# Patient Record
Sex: Male | Born: 1965 | Race: Black or African American | Hispanic: No
Health system: Midwestern US, Community
[De-identification: ages and names within clinical notes are randomized; demographics above are authoritative.]

## PROBLEM LIST (undated history)

## (undated) DIAGNOSIS — F4310 Post-traumatic stress disorder, unspecified: Secondary | ICD-10-CM

## (undated) DIAGNOSIS — H409 Unspecified glaucoma: Secondary | ICD-10-CM

---

## 2014-05-10 ENCOUNTER — Emergency Department (HOSPITAL_COMMUNITY)
Admission: EM | Admit: 2014-05-10 | Discharge: 2014-05-10 | Disposition: A | Discharge status: Home / Self Care | Payer: Non-veteran care | Attending: Emergency Medicine | Admitting: Emergency Medicine

## 2014-05-10 ENCOUNTER — Encounter (HOSPITAL_COMMUNITY): Payer: Self-pay | Admitting: Emergency Medicine

## 2014-05-10 DIAGNOSIS — S0083XA Contusion of other part of head, initial encounter: Secondary | ICD-10-CM | POA: Insufficient documentation

## 2014-05-10 DIAGNOSIS — S0003XA Contusion of scalp, initial encounter: Secondary | ICD-10-CM | POA: Diagnosis not present

## 2014-05-10 DIAGNOSIS — S0190XA Unspecified open wound of unspecified part of head, initial encounter: Secondary | ICD-10-CM | POA: Diagnosis present

## 2014-05-10 DIAGNOSIS — S0191XA Laceration without foreign body of unspecified part of head, initial encounter: Secondary | ICD-10-CM

## 2014-05-10 DIAGNOSIS — S1093XA Contusion of unspecified part of neck, initial encounter: Secondary | ICD-10-CM

## 2014-05-10 DIAGNOSIS — H409 Unspecified glaucoma: Secondary | ICD-10-CM | POA: Diagnosis not present

## 2014-05-10 DIAGNOSIS — Z8659 Personal history of other mental and behavioral disorders: Secondary | ICD-10-CM | POA: Insufficient documentation

## 2014-05-10 DIAGNOSIS — F17200 Nicotine dependence, unspecified, uncomplicated: Secondary | ICD-10-CM | POA: Insufficient documentation

## 2014-05-10 HISTORY — DX: Unspecified glaucoma: H40.9

## 2014-05-10 HISTORY — DX: Post-traumatic stress disorder, unspecified: F43.10

## 2014-05-10 MED ORDER — FLUORESCEIN SODIUM 1 MG OP STRP
1.0000 | ORAL_STRIP | Freq: Once | OPHTHALMIC | Status: AC
  Administered 2014-05-10: 12:00:00 via OPHTHALMIC
  Filled 2014-05-10: qty 1

## 2014-05-10 MED ORDER — TETRACAINE HCL 0.5 % OP SOLN
1.0000 [drp] | Freq: Once | OPHTHALMIC | Status: AC
  Administered 2014-05-10: 1 [drp] via OPHTHALMIC
  Filled 2014-05-10: qty 2

## 2014-05-10 NOTE — ED Provider Notes (Signed)
Medical screening examination/treatment/procedure(s) were performed by non-physician practitioner and as supervising physician I was immediately available for consultation/collaboration.   EKG Interpretation None        Shon Baton, MD 05/10/14 1944

## 2014-05-10 NOTE — ED Notes (Signed)
Pt. Refused visual acuity.
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
## 2014-05-10 NOTE — Discharge Instructions (Signed)
Please follow up with your primary care physician in 1-2 days. If you do not have one please call the [facility] and wellness Center number listed above. Please follow up with ENT Doctor as needed to schedule a follow up appointment.  Please read all discharge instructions and return precautions.    Head Injury You have received a head injury. It does not appear serious at this time. Headaches and vomiting are common following head injury. It should be easy to awaken from sleeping. Sometimes it is necessary for you to stay in the emergency department for a while for observation. Sometimes admission to the hospital may be needed. After injuries such as yours, most problems occur within the first 24 hours, but side effects may occur up to 7-10 days after the injury. It is important for you to carefully monitor your condition and contact your health care provider or seek immediate medical care if there is a change in your condition. WHAT ARE THE TYPES OF HEAD INJURIES? Head injuries can be as minor as a bump. Some head injuries can be more severe. More severe head injuries include:  A jarring injury to the brain (concussion).  A bruise of the brain (contusion). This mean there is bleeding in the brain that can cause swelling.  A cracked skull (skull fracture).  Bleeding in the brain that collects, clots, and forms a bump (hematoma). WHAT CAUSES A HEAD INJURY? A serious head injury is most likely to happen to someone who is in a car wreck and is not wearing a seat belt. Other causes of major head injuries include bicycle or motorcycle accidents, sports injuries, and falls. HOW ARE HEAD INJURIES DIAGNOSED? A complete history of the event leading to the injury and your current symptoms will be helpful in diagnosing head injuries. Many times, pictures of the brain, such as CT or MRI are needed to see the extent of the injury. Often, an overnight hospital stay is necessary for observation.  WHEN SHOULD  I SEEK IMMEDIATE MEDICAL CARE?  You should get help right away if:  You have confusion or drowsiness.  You feel sick to your stomach (nauseous) or have continued, forceful vomiting.  You have dizziness or unsteadiness that is getting worse.  You have severe, continued headaches not relieved by medicine. Only take over-the-counter or prescription medicines for pain, fever, or discomfort as directed by your health care provider.  You do not have normal function of the arms or legs or are unable to walk.  You notice changes in the black spots in the center of the colored part of your eye (pupil).  You have a clear or bloody fluid coming from your nose or ears.  You have a loss of vision. During the next 24 hours after the injury, you must stay with someone who can watch you for the warning signs. This person should contact local emergency services (911 in the [place] if you have seizures, you become unconscious, or you are unable to wake up. HOW CAN I PREVENT A HEAD INJURY IN THE FUTURE? The most important factor for preventing major head injuries is avoiding motor vehicle accidents. To minimize the potential for damage to your head, it is crucial to wear seat belts while riding in motor vehicles. Wearing helmets while bike riding and playing collision sports (like football) is also helpful. Also, avoiding dangerous activities around the house will further help reduce your risk of head injury.  WHEN CAN I RETURN TO NORMAL ACTIVITIES AND ATHLETICS?  You should be reevaluated by your health care provider before returning to these activities. If you have any of the following symptoms, you should not return to activities or contact sports until 1 week after the symptoms have stopped:  Persistent headache.  Dizziness or vertigo.  Poor attention and concentration.  Confusion.  Memory problems.  Nausea or vomiting.  Fatigue or tire easily.  Irritability.  Intolerant of bright lights or  loud noises.  Anxiety or depression.  Disturbed sleep. MAKE SURE YOU:   Understand these instructions.  Will watch your condition.  Will get help right away if you are not doing well or get worse. Document Released: 07/31/2005 Document Revised: 08/05/2013 Document Reviewed: 04/07/2013 [facility] Patient Information 2015 [place], [place]. This information is not intended to replace advice given to you by your health care provider. Make sure you discuss any questions you have with your health care provider. Non-Sutured Laceration A laceration is a cut or wound that goes through all layers of the skin and into the tissue just beneath the skin. Usually, these are stitched up or held together with tape or glue shortly after the injury occurred. However, if several or more hours have passed before getting care, too many germs (bacteria) get into the laceration. Stitching it closed would bring the risk of infection. If your health care provider feels your laceration is too old, it may be left open and then bandaged to allow healing from the bottom layer up. HOME CARE INSTRUCTIONS   Change the bandage (dressing) 2 times a day or as directed by your health care provider.  If the dressing or packing gauze sticks, soak it off with soapy water.  When you re-bandage your laceration, make sure that the dressing or packing gauze goes all the way to the bottom of the laceration. The top of the laceration is kept open so it can heal from the bottom up. There is less chance for infection with this method.  Wash the area with soap and water 2 times a day to remove all the creams or ointments, if used. Rinse off the soap. Pat the area dry with a clean towel. Look for signs of infection, such as redness, swelling, or a red line that goes away from the laceration.  Re-apply creams or ointments if they were used to bandage the laceration. This helps keep the bandage from sticking.  If the bandage becomes wet,  dirty, or has a bad smell, change it as soon as possible.  Only take medicine as directed by your health care provider. You might need a tetanus shot now if:  You have no idea when you had the last one.  You have never had a tetanus shot before.  Your laceration had dirt in it.  Your laceration was dirty, and your last tetanus shot was more than 7 years ago.  Your laceration was clean, and your last tetanus shot was more than 10 years ago. If you need a tetanus shot, and you decide not to get one, there is a rare chance of getting tetanus. Sickness from tetanus can be serious. If you got a tetanus shot, your arm may swell and get red and warm to the touch at the shot site. This is common and not a problem. SEEK MEDICAL CARE IF:   You have redness, swelling, or increasing pain in the laceration.  You notice a red line that goes away from your laceration.  You have pus coming from the laceration.  You have  a fever.  You notice a bad smell coming from the laceration or dressing.  You notice something coming out of the laceration, such as wood or glass.  Your laceration is on your hand or foot and you are unable to properly move a finger or toe.  You have severe swelling around the laceration, causing pain and numbness.  You notice a change in color in your arm, hand, leg, or foot. MAKE SURE YOU:   Understand these instructions.  Will watch your condition.  Will get help right away if you are not doing well or get worse. Document Released: 06/28/2006 Document Revised: 08/05/2013 Document Reviewed: 01/18/2009 [facility] Patient Information 2015 [place], [place]. This information is not intended to replace advice given to you by your health care provider. Make sure you discuss any questions you have with your health care provider.
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
## 2020-10-20 ENCOUNTER — Inpatient Hospital Stay
Admit: 2020-10-20 | Discharge: 2020-10-23 | Disposition: A | Discharge status: Other Acute Inpatient Hospital | Attending: Emergency Medicine

## 2020-10-20 ENCOUNTER — Emergency Department: Admit: 2020-10-20 | Primary: Family Medicine

## 2020-10-20 ENCOUNTER — Emergency Department: Primary: Family Medicine

## 2020-10-20 DIAGNOSIS — F29 Unspecified psychosis not due to a substance or known physiological condition: Secondary | ICD-10-CM

## 2020-10-20 LAB — METABOLIC PANEL, COMPREHENSIVE
ALT (SGPT): 42 U/L (ref 12–78)
AST (SGOT): 42 U/L — ABNORMAL HIGH (ref 15–37)
Albumin: 4.4 gm/dl (ref 3.4–5.0)
Alk. phosphatase: 77 U/L (ref 45–117)
Anion gap: 7 mmol/L (ref 5–15)
BUN: 19 mg/dl (ref 7–25)
Bilirubin, total: 0.9 mg/dl (ref 0.2–1.0)
CO2: 28 mEq/L (ref 21–32)
Calcium: 10.1 mg/dl (ref 8.5–10.1)
Chloride: 105 mEq/L (ref 98–107)
Creatinine: 1.1 mg/dl (ref 0.6–1.3)
GFR est AA: >60
GFR est non-AA: >60
Glucose: 68 mg/dl — ABNORMAL LOW (ref 74–106)
Potassium: 4.5 mEq/L (ref 3.5–5.1)
Protein, total: 8.1 gm/dl (ref 6.4–8.2)
Sodium: 140 mEq/L (ref 136–145)

## 2020-10-20 LAB — ETHYL ALCOHOL
ALCOHOL(ETHYL),SERUM: <3 mg/dl (ref 0.0–9.0)
Ethyl Alcohol: <3 mg/dl (ref 0.0–9.0)

## 2020-10-20 LAB — LIPASE
Lipase: 58 U/L — ABNORMAL LOW (ref 73–393)
Lipase: 58 U/L — ABNORMAL LOW (ref 73–393)

## 2020-10-20 LAB — ACETAMINOPHEN: Acetaminophen level: <2 ug/mL — ABNORMAL LOW (ref 10.0–30.0)

## 2020-10-20 LAB — CBC WITH AUTOMATED DIFF
BASOPHILS: 0.4 % (ref 0–3)
EOSINOPHILS: 0 % (ref 0–5)
HCT: 43.2 % (ref 37.0–50.0)
HGB: 14.3 gm/dl (ref 12.4–17.2)
IMMATURE GRANULOCYTES: 0.2 % (ref 0.0–3.0)
LYMPHOCYTES: 14.2 % — ABNORMAL LOW (ref 28–48)
MCH: 30 pg (ref 23.0–34.6)
MCHC: 33.1 gm/dl (ref 30.0–36.0)
MCV: 90.8 fL (ref 80.0–98.0)
MONOCYTES: 8.6 % (ref 1–13)
MPV: 10.2 fL — ABNORMAL HIGH (ref 6.0–10.0)
NEUTROPHILS: 76.6 % — ABNORMAL HIGH (ref 34–64)
NRBC: 0 (ref 0–0)
PLATELET: 292 10*3/uL (ref 140–450)
RBC: 4.76 M/uL (ref 3.80–5.70)
RDW-SD: 45.1 — ABNORMAL HIGH (ref 35.1–43.9)
WBC: 11.3 10*3/uL — ABNORMAL HIGH (ref 4.0–11.0)

## 2020-10-20 LAB — COVID-19 (INPATIENT TESTING): COVID-19: NEGATIVE

## 2020-10-20 LAB — SALICYLATE
Salicyclic Acid: 2.4 mg/dl — ABNORMAL LOW (ref 2.8–20.0)
Salicylate: 2.4 mg/dl — ABNORMAL LOW (ref 2.8–20.0)

## 2020-10-20 LAB — GLUCOSE, POC: Glucose (POC): 67 mg/dL (ref 65–105)

## 2020-10-20 LAB — COVID-19: COVID-19: NEGATIVE

## 2020-10-20 LAB — COMPREHENSIVE METABOLIC PANEL
ALT: 42 U/L (ref 12–78)
AST: 42 U/L — ABNORMAL HIGH (ref 15–37)
Albumin: 4.4 gm/dl (ref 3.4–5.0)
Alkaline Phosphatase: 77 U/L (ref 45–117)
Anion Gap: 7 mmol/L (ref 5–15)
BUN: 19 mg/dl (ref 7–25)
CO2: 28 mEq/L (ref 21–32)
Calcium: 10.1 mg/dl (ref 8.5–10.1)
Chloride: 105 mEq/L (ref 98–107)
Creatinine: 1.1 mg/dl (ref 0.6–1.3)
EGFR IF NonAfrican American: >60
GFR African American: >60
Glucose: 68 mg/dl — ABNORMAL LOW (ref 74–106)
Potassium: 4.5 mEq/L (ref 3.5–5.1)
Sodium: 140 mEq/L (ref 136–145)
Total Bilirubin: 0.9 mg/dl (ref 0.2–1.0)
Total Protein: 8.1 gm/dl (ref 6.4–8.2)

## 2020-10-20 LAB — CBC WITH AUTO DIFFERENTIAL
Basophils %: 0.4 % (ref 0–3)
Eosinophils %: 0 % (ref 0–5)
Hematocrit: 43.2 % (ref 37.0–50.0)
Hemoglobin: 14.3 gm/dl (ref 12.4–17.2)
Immature Granulocytes: 0.2 % (ref 0.0–3.0)
Lymphocytes %: 14.2 % — ABNORMAL LOW (ref 28–48)
MCH: 30 pg (ref 23.0–34.6)
MCHC: 33.1 gm/dl (ref 30.0–36.0)
MCV: 90.8 fL (ref 80.0–98.0)
MPV: 10.2 fL — ABNORMAL HIGH (ref 6.0–10.0)
Monocytes %: 8.6 % (ref 1–13)
Neutrophils %: 76.6 % — ABNORMAL HIGH (ref 34–64)
Nucleated RBCs: 0 (ref 0–0)
Platelets: 292 10*3/uL (ref 140–450)
RBC: 4.76 M/uL (ref 3.80–5.70)
RDW-SD: 45.1 — ABNORMAL HIGH (ref 35.1–43.9)
WBC: 11.3 10*3/uL — ABNORMAL HIGH (ref 4.0–11.0)

## 2020-10-20 LAB — ACETAMINOPHEN LEVEL: Acetaminophen Level: <2 ug/mL — ABNORMAL LOW (ref 10.0–30.0)

## 2020-10-20 LAB — POCT GLUCOSE: POC Glucose: 67 mg/dL (ref 65–105)

## 2020-10-20 MED ORDER — ZIPRASIDONE MESYLATE 20 MG IM SOLR
20 mg/mL (final conc.) | Freq: Once | INTRAMUSCULAR | Status: AC
Start: 2020-10-20 — End: 2020-10-20
  Administered 2020-10-20: 18:00:00 via INTRAMUSCULAR

## 2020-10-20 MED ORDER — IOPAMIDOL 61 % IV SOLN
300 mg iodine /mL (61 %) | Freq: Once | INTRAVENOUS | Status: AC
Start: 2020-10-20 — End: 2020-10-21

## 2020-10-20 MED ORDER — LORAZEPAM 2 MG/ML IJ SOLN
2 mg/mL | Freq: Once | INTRAMUSCULAR | Status: AC
Start: 2020-10-20 — End: 2020-10-20
  Administered 2020-10-20: 18:00:00 via INTRAMUSCULAR

## 2020-10-20 MED FILL — GEODON 20 MG/ML (FINAL CONCENTRATION) INTRAMUSCULAR SOLUTION: 20 mg/mL (final conc.) | INTRAMUSCULAR | Qty: 20

## 2020-10-20 MED FILL — ISOVUE-300  61 % INTRAVENOUS SOLUTION: 300 mg iodine /mL (61 %) | INTRAVENOUS | Qty: 80

## 2020-10-20 MED FILL — LORAZEPAM 2 MG/ML IJ SOLN: 2 mg/mL | INTRAMUSCULAR | Qty: 1
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
## 2020-10-20 NOTE — ED Notes (Signed)
Pt endorses "a stressful year.  The last 30 days I have been having episodes of ranting and raving and I've been thinking everyone is following me.  I've been having thoughts of hurting myself but I don't have a plan".
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
## 2020-10-20 NOTE — Progress Notes (Signed)
PSYCH NOTE: The tele-psychiatrist just called and is in the process of doing the patient's tele-psych assessment. The tele-psych video monitor is currently set up in the patient's room. Writer transferred the tele-psychiatrist to the assigned nurse for chart info prior to tele-psych screening of this patient

## 2020-10-20 NOTE — Progress Notes (Signed)
 PSYCH UPDATE NOTE: Writer requested teled-psych consult at follows, Pt homeless. Here initially voluntarily. Voicing thought disorder with alterations in perceptions (Visual and auditory) and his actions and  behaviors appear to be dictated by unrealistic paranoid thinking Pt started to distrust the ER treatment team and onset of refusing tx approaches. Pt wanting to leave. Suspect Hx of non-compliant with past psych tx and meds. Pt voiced SI thoughts regarding mental illness, but no plan. Wanting to leave. Legal hold in place. Please make meds. Recommendation. The tele-psych request was completed at 1245 ID: [id number]  .
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
## 2020-10-20 NOTE — ED Notes (Signed)
 Assumed care of pt @ 0940. Received report from triage. Pt currently sitting in chair in room. Calm & cooperative.    1000: Pt displaying paranoia & cowering on rolling chair stating that the dr is not actually the dr & that this RN is not the same RN. Pt refusing to answer questions or allow additional labs.    1030: Pt walking in & out of room but able to be redirected. [facility] called.    1100: Pt wanting to leave. Still displaying paranoia, lack of understanding of reality (possible having stress induced flashback related to PTSD pt diagnosed w/). ECO being sought.     1116: magistrate issued ECO now active. Pt moved to secured room    Pt received PRN to aid visible distress, AEB by pt cowering in chair, in corner of room on floor, jumping when staff approach, telling staff to stay back. Don't get too close,  etc... Pt able to calm enough to allow finger stick BG as BMP showed mildly low BG. Pt then drank x2 OJ (total ) on request of staff about an hour between them. Pt allowed COVID test & was then agreeable to MD ordered CT. Pt cooperative w/ process. Remained resting on mattress for remainder of shift. Dinner tray provided, however pt continued resting.     Report given to [place], [place]

## 2020-10-20 NOTE — ED Notes (Signed)
6:38 PM Turnover from Dr. Thana Ates.  Patient is under TDO due to acute psychosis.  Patient is medical cleared for further psychiatric evaluation.  Awaiting psychiatric placement.    8:41 PM Tele-psychiatrist evaluated the patient and states that patient will need inpatient psychiatric treatment due to acute psychosis.  She recommends to start Zyprexa 5 mg twice a day.  Patient will continue in our ED awaiting psychiatric placement.      ICD-10-CM ICD-9-CM   1. Psychosis, unspecified psychosis type (HCC)  F29 298.9

## 2020-10-20 NOTE — ED Notes (Signed)
 Attempt to place PIV on pt for CT unsuccessful. Pt refused and stated I want to speak to the doctor, I want to speak to the doctor, I'm not letting you do that.

## 2020-10-20 NOTE — Progress Notes (Signed)
BEHAVIORAL HEALTH CONSULT:  Writer met with pt who at the time was sitting hunched over in chair wrapped in blanket. He presented with a frightened affect that included extreme paranoia and tearfulness. Mood was congruent. Pt hesitantly endorsed AH, stating, "They just talk, and they say I did it." He denied VH, and denied HI, but endorsed vague SI as a result of what's been going on with him. Pt reported, "It's my PTSD." He shared he was in the [employer] and took medication for depression many years ago. He was unable to recall when he retired from [employer], and pt was only oriented to self. Pt was responding to internal stimuli as evidenced by eyes darting about the room, stuttering, and severe thought blocking. Pt shouted, "I don't even fucking stutter! "Pt reported a family hx of mental illness that includes a mother with schizophrenia. Pt endorsed a hx of substance use with writer's attending, still waiting on labs.    Pt reported a decline in ADLs such as not eating or sleeping. He shared, "All I do is watch TV and it tells me I did bad things." Writer and attending agreed with requesting an ECO/TDO due to pt's lack of capacity to give informed consent, as well as his inability to care for self. Attending, Dr. Dionicia Abler would like to examine pt's head, and reported pt asked for his clothes back. An ECO was issued at 11:16,  and TDO was supported by Marylene Land B. from [facility]. Pt is to remain in the ED with monitoring from CPD and video until IP psych placement is secured.

## 2020-10-21 LAB — GLUCOSE, POC: Glucose (POC): 127 mg/dL — ABNORMAL HIGH (ref 65–105)

## 2020-10-21 LAB — POCT GLUCOSE: POC Glucose: 127 mg/dL — ABNORMAL HIGH (ref 65–105)

## 2020-10-21 MED ORDER — OLANZAPINE 2.5 MG TAB
2.5 mg | Freq: Two times a day (BID) | ORAL | Status: DC
Start: 2020-10-21 — End: 2020-10-23
  Administered 2020-10-22 – 2020-10-23 (×2): via ORAL

## 2020-10-21 MED FILL — OLANZAPINE 2.5 MG TAB: 2.5 mg | ORAL | Qty: 2

## 2020-10-21 NOTE — ED Notes (Signed)
Patient refused to have his bed put into his room and prefers to sleep on the mattress because he is paranoid and  thinks that staff will try to restrain him to the bed rails.

## 2020-10-21 NOTE — Progress Notes (Signed)
[facility]  Emergency Department Mental Health Turnover Note      History of Present Illness   55 y.o. male turned over to me by Dr. Raynald Kemp at the end of her shift.  Reviewed history and physical exam, as well as results of completed studies.  Patient noted to be medically cleared at this time.  Patient is currently on a temporary detainment order (TDO) and pending accepting facility for inpatient psychiatric treatment at this time.    Progress Notes     4:30 PM  Turned over to Dr. Arvella Merles at the end of my shift with patient pending accepting facility for inpatient psychiatric treatment at this time.  We dicussed that there were no significant issues during my shift.    Final Diagnosis       ICD-10-CM ICD-9-CM   1. Psychosis, unspecified psychosis type (HCC)  F29 298.9       Disposition   Turned over to Dr. Arvella Merles at the end of my shift with patient pending accepting facility for inpatient psychiatric treatment at this time.         Johny Shock, DO  October 21, 2020    My signature above authenticates this document and my orders, the final    diagnosis (es), discharge prescription (s), and instructions in the Epic    record.  If you have any questions please contact [phone].     Nursing notes have been reviewed by the physician/ advanced practice    Clinician.    Dragon medical dictation software was used for portions of this report. Unintended voice recognition grammatical errors may occur.

## 2020-10-21 NOTE — ED Notes (Signed)
 ED Notes by Craig League, RN at 10/21/20 1950                Author: Craig League, RN  Service: NURSING  Author Type: Registered Nurse       Filed: 10/22/20 0050  Date of Service: 10/21/20 1950  Status: Signed          Editor: Craig League, RN (Registered Nurse)               [phone] Assumed pt care. Pt report received from Caprice, RN.      1950 Pt assessed. Pt calm, cooperative. Alert and oriented to person and place. Answers questions appropriately with clear speech. Pt states I'm fine when asked how he is doing and It'll be alright when asked about pain. Pt repeated same thing ma'am  x 3 as responses for SI/HI/AVH without providing specific details. Pt voices no concerns at this time. Food and drink requested and provided.

## 2020-10-21 NOTE — ED Notes (Addendum)
ED Notes by Aldean Baker at 10/21/20 2017                Author: Vita Erm M  Service: --  Author Type: Technician       Filed: 10/21/20 2018  Date of Service: 10/21/20 2017  Status: Signed          Editor: Aldean Baker (Technician)               Provided PT with regular lunch box. Ensured there were no utensils with the RN in the box. Provided new psych spoon.

## 2020-10-21 NOTE — ED Notes (Addendum)
ED Notes by Smitty Cords, MD at 10/21/20 2301                Author: Smitty Cords, MD  Service: EMERGENCY  Author Type: Physician       Filed: 10/21/20 2302  Date of Service: 10/21/20 2301  Status: Signed          Editor: Smitty Cords, MD (Physician)               No complaints during my patient's shift here.  Bed search still continuing.. Dr Raynald Kemp to follow

## 2020-10-22 MED FILL — OLANZAPINE 2.5 MG TAB: 2.5 mg | ORAL | Qty: 2

## 2020-10-22 NOTE — ED Notes (Signed)
ED Notes by Drema Halon D at 10/22/20 2148                Author: Drema Halon D  Service: --  Author Type: Technician       Filed: 10/22/20 2148  Date of Service: 10/22/20 2148  Status: Signed          Editor: Drema Halon D (Technician)               Provided psych safe box lunch upon request.

## 2020-10-22 NOTE — ED Notes (Signed)
ED Notes by Erling Conte, MD at 10/22/20 1634                Author: Erling Conte, MD  Service: --  Author Type: Physician       Filed: 10/22/20 1634  Date of Service: 10/22/20 1634  Status: Signed          Editor: Erling Conte, MD (Physician)               Patient signed out to me by Dr. Clarene Duke awaiting psychiatric transfer on TDO.  Patient has been accepted at [facility] by Dr. Barbie Haggis.  He remains  medically stable for transfer and is awaiting transport.
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
## 2020-10-22 NOTE — Progress Notes (Signed)
Progress  Notes by Ronnette Juniper R at 10/22/20 1824                Author: Ronnette Juniper R  Service: --  Author Type: Counselor       Filed: 10/22/20 1825  Date of Service: 10/22/20 1824  Status: Signed          Editor: Ronnette Juniper R (Counselor)               The note has been blocked for the following reason: Likely risk of substantial harm                              CRISIS UPDATE: pt accepted to [facility] under the c/o Dr. Barbie Haggis @ [id number].    Ph# for nurse report is [phone].It was provided to Nurse Caprice S.

## 2020-10-22 NOTE — ED Notes (Signed)
ED Notes by Jerilynn Som, MD at 10/22/20 0038                Author: Jerilynn Som, MD  Service: EMERGENCY  Author Type: Physician       Filed: 10/22/20 0045  Date of Service: 10/22/20 0038  Status: Signed          Editor: Jerilynn Som, MD (Physician)               Pt care assumed from Dr Arvella Merles   Pt presentation, current treatment plan, progression and available diagnostic results discussed.       TDO supported by [facility] for SI without plan, psychosis and paranoia, currently homeless.        Telepsych consulted 10/20/20 - recommend inpatient mental health treatment.  In ED start Zyprexia 5mg  po BID, which has been ordered but patient refused.      No issues overnight.   Awaiting bed search.

## 2020-10-22 NOTE — ED Notes (Signed)
ED Notes by Drema Halon D at 10/22/20 2245                Author: Drema Halon D  Service: --  Author Type: Technician       Filed: 10/22/20 2245  Date of Service: 10/22/20 2245  Status: Signed          Editor: Drema Halon D (Technician)               Pt belongings given to transport sheriff.

## 2020-10-22 NOTE — ED Notes (Signed)
ED Notes by  Lajean Manes, RN at 10/22/20 1055                Author: Lajean Manes, RN  Service: Emergency Medicine  Author Type: Registered Nurse       Filed: 10/22/20 1055  Date of Service: 10/22/20 1055  Status: Signed          Editor: Lajean Manes, RN (Registered Nurse)               Patient received a shower with full linen change today.

## 2020-10-22 NOTE — ED Notes (Signed)
ED Notes by Drema Halon D at 10/22/20 1748                Author: Drema Halon D  Service: --  Author Type: Technician       Filed: 10/22/20 1749  Date of Service: 10/22/20 1748  Status: Signed          Editor: Drema Halon D (Technician)               Provided pt with box lunch - knife removed from box

## 2020-10-22 NOTE — ED Notes (Signed)
ED Notes by Drema Halon D at 10/22/20 1918                Author: Drema Halon D  Service: --  Author Type: Technician       Filed: 10/22/20 1918  Date of Service: 10/22/20 1918  Status: Signed          Editor: Drema Halon D (Technician)               Dinner tray delivered.

## 2020-10-22 NOTE — ED Notes (Signed)
ED Notes by  Lajean Manes, RN at 10/22/20 1610                Author: Lajean Manes, RN  Service: Emergency Medicine  Author Type: Registered Nurse       Filed: 10/22/20 1611  Date of Service: 10/22/20 1610  Status: Signed          Editor: Lajean Manes, RN (Registered Nurse)               Report given to Alesia Banda, RN at [facility].

## 2020-10-22 NOTE — Progress Notes (Addendum)
Progress Notes by Johny Shock, DO at 10/22/20 0932                Author: Johny Shock, DO  Service: Emergency Medicine  Author Type: Physician       Filed: 10/22/20 1518  Date of Service: 10/22/20 0846  Status: Signed          Editor: Johny Shock, DO (Physician)               [facility]   Emergency Department Mental Health Turnover Note           History of Present Illness     55 y.o. male  turned over to me by Dr. Raynald Kemp at the end of her shift.  Reviewed history and physical exam, as well as results of completed studies.  Briefly, this is a patient with a history of PTSD over the past 20 years with report of auditory hallucinations.  He  reports some paranoid delusions of people being out to get him.  He denies any specific plan of suicide, though he does admit to having occasional thoughts of suicide.  He denies any homicidal ideations.      Patient noted to be medically cleared at this time.  Patient is currently on a temporary detainment order (TDO) and pending accepting facility for inpatient psychiatric treatment at this time.        Progress Notes        12:09 PM   Updated that the patient is being considered by [facility] with request to obtain an EKG for further medical clearance.       EKG Interpretation      An EKG was obtained in the evaluation of this patient at 1236, and read at 1237.  I have personally read and interpreted this EKG as follows:      EKG demonstrates normal sinus rhythm at a rate of 74 beats per minute with a normal axis. PR interval is mildly prolonged at 202 ms.  QRS interval is normal at 86 ms. QT interval is normal at 412 ms. There are no ST or T wave changes suggestive of acute  ischemia or infarction.         Johny Shock, DO   October 22, 2020      3:25 PM   Turned over to Dr. Kizzie Bane at the end of my shift with patient pending accepting facility for inpatient psychiatric treatment at this time.  We dicussed that the patient  is under review from [facility] for possible acceptance with EKG sent  at request of provider.        Final Diagnosis                 ICD-10-CM  ICD-9-CM          1.  Psychosis, unspecified psychosis type (HCC)   F29  298.9             Disposition     Turned over to Dr. Kizzie Bane at the end of my shift with patient pending accepting facility for inpatient psychiatric treatment at this time.             Johny Shock, DO   October 22, 2020      My signature above authenticates this document and my orders, the final     diagnosis (es), discharge prescription (s), and instructions in the Epic     record.   If you have any  questions please contact [phone].       Nursing notes have been reviewed by the physician/ advanced practice     Clinician.      Dragon medical dictation software was used for portions of this report. Unintended voice recognition grammatical errors may occur.

## 2020-10-22 NOTE — Progress Notes (Signed)
Progress Notes by Cecil Cobbs, RN at 10/22/20 6222                Author: Cecil Cobbs, RN  Service: Behavioral Health  Author Type: Registered Nurse       Filed: 10/22/20 0648  Date of Service: 10/22/20 0627  Status: Signed          Editor: Cecil Cobbs, RN (Registered Nurse)               The note has been blocked for the following reason: Likely risk of substantial harm                              BED SEARCH STATUS UPDATE:   [facility] Emergency Services (ES) contacted for bed status update. No bed availability for this TDO referral this shift. Bed search status update provided  to [facility] attending MD, Dr. Raynald Kemp, [facility] in attendance and assigned RN, Annetta Maw . Patient resting at this time. Video monitor and periodic checks maintained for safety.

## 2020-10-22 NOTE — ED Notes (Signed)
ED Notes by  Clovis Pu, RN at 10/22/20 2105                Author: Clovis Pu, RN  Service: Emergency Medicine  Author Type: Registered Nurse       Filed: 10/22/20 2211  Date of Service: 10/22/20 2105  Status: Signed          Editor: Clovis Pu, RN (Registered Nurse)               Pt medicated per [facility], allergies verified. Pt given some OJ to drink. Pt stated he didn't need anything else. Asked if he would like the bed frame to get the mattress  off of the floor pt refused.

## 2020-10-23 MED FILL — OLANZAPINE 2.5 MG TAB: 2.5 mg | ORAL | Qty: 2

## 2021-12-10 ENCOUNTER — Emergency Department (HOSPITAL_COMMUNITY): Payer: Medicaid Other

## 2021-12-10 ENCOUNTER — Encounter (HOSPITAL_COMMUNITY): Payer: Self-pay | Admitting: Emergency Medicine

## 2021-12-10 ENCOUNTER — Observation Stay (HOSPITAL_COMMUNITY): Payer: Medicaid Other

## 2021-12-10 ENCOUNTER — Observation Stay (HOSPITAL_BASED_OUTPATIENT_CLINIC_OR_DEPARTMENT_OTHER): Payer: Medicaid Other

## 2021-12-10 ENCOUNTER — Observation Stay (HOSPITAL_COMMUNITY)
Admission: EM | Admit: 2021-12-10 | Discharge: 2021-12-11 | Disposition: A | Discharge status: Home / Self Care | Payer: Medicaid Other | Attending: Internal Medicine | Admitting: Internal Medicine

## 2021-12-10 ENCOUNTER — Other Ambulatory Visit: Payer: Self-pay

## 2021-12-10 DIAGNOSIS — R9431 Abnormal electrocardiogram [ECG] [EKG]: Secondary | ICD-10-CM | POA: Diagnosis not present

## 2021-12-10 DIAGNOSIS — Z7901 Long term (current) use of anticoagulants: Secondary | ICD-10-CM | POA: Insufficient documentation

## 2021-12-10 DIAGNOSIS — I2699 Other pulmonary embolism without acute cor pulmonale: Secondary | ICD-10-CM

## 2021-12-10 DIAGNOSIS — N179 Acute kidney failure, unspecified: Secondary | ICD-10-CM | POA: Insufficient documentation

## 2021-12-10 DIAGNOSIS — R0789 Other chest pain: Secondary | ICD-10-CM | POA: Diagnosis present

## 2021-12-10 DIAGNOSIS — R079 Chest pain, unspecified: Secondary | ICD-10-CM

## 2021-12-10 DIAGNOSIS — F17200 Nicotine dependence, unspecified, uncomplicated: Secondary | ICD-10-CM | POA: Insufficient documentation

## 2021-12-10 DIAGNOSIS — Z720 Tobacco use: Secondary | ICD-10-CM

## 2021-12-10 DIAGNOSIS — Z86711 Personal history of pulmonary embolism: Secondary | ICD-10-CM

## 2021-12-10 LAB — URINALYSIS, ROUTINE W REFLEX MICROSCOPIC
Bilirubin Urine: NEGATIVE
Glucose, UA: NEGATIVE mg/dL
Hgb urine dipstick: NEGATIVE
Ketones, ur: NEGATIVE mg/dL
Leukocytes,Ua: NEGATIVE
Nitrite: NEGATIVE
Protein, ur: NEGATIVE mg/dL
Specific Gravity, Urine: >1.046 — ABNORMAL HIGH (ref 1.005–1.030)
pH: 5 (ref 5.0–8.0)

## 2021-12-10 LAB — ECHOCARDIOGRAM COMPLETE
AR max vel: 2.16 cm2
AV Area VTI: 2.36 cm2
AV Area mean vel: 2.31 cm2
AV Mean grad: 5 mmHg
AV Peak grad: 10.2 mmHg
Ao pk vel: 1.6 m/s
Area-P 1/2: 2.13 cm2
S' Lateral: 3.6 cm

## 2021-12-10 LAB — TROPONIN I (HIGH SENSITIVITY)
Troponin I (High Sensitivity): 8 ng/L (ref <18)
Troponin I (High Sensitivity): 6 ng/L (ref <18)

## 2021-12-10 LAB — CBC
HCT: 41.2 % (ref 39.0–52.0)
Hemoglobin: 14.2 g/dL (ref 13.0–17.0)
MCH: 31 pg (ref 26.0–34.0)
MCHC: 34.5 g/dL (ref 30.0–36.0)
MCV: 90 fL (ref 80.0–100.0)
Platelets: 215 10*3/uL (ref 150–400)
RBC: 4.58 MIL/uL (ref 4.22–5.81)
RDW: 13.7 % (ref 11.5–15.5)
WBC: 4.7 10*3/uL (ref 4.0–10.5)
nRBC: 0 % (ref 0.0–0.2)

## 2021-12-10 LAB — BASIC METABOLIC PANEL
Anion gap: 7 (ref 5–15)
BUN: 13 mg/dL (ref 6–20)
CO2: 25 mmol/L (ref 22–32)
Calcium: 9.1 mg/dL (ref 8.9–10.3)
Chloride: 108 mmol/L (ref 98–111)
Creatinine, Ser: 1.27 mg/dL — ABNORMAL HIGH (ref 0.61–1.24)
GFR, Estimated: >60 mL/min (ref >60)
Glucose, Bld: 140 mg/dL — ABNORMAL HIGH (ref 70–99)
Potassium: 3.7 mmol/L (ref 3.5–5.1)
Sodium: 140 mmol/L (ref 135–145)

## 2021-12-10 LAB — HIV ANTIBODY (ROUTINE TESTING W REFLEX): HIV Screen 4th Generation wRfx: NONREACTIVE

## 2021-12-10 MED ORDER — IOHEXOL 350 MG/ML SOLN
80.0000 mL | Freq: Once | INTRAVENOUS | Status: AC | PRN
Start: 2021-12-10 — End: 2021-12-10
  Administered 2021-12-10: 80 mL via INTRAVENOUS

## 2021-12-10 MED ORDER — SODIUM CHLORIDE 0.9% FLUSH
3.0000 mL | Freq: Two times a day (BID) | INTRAVENOUS | Status: DC
  Administered 2021-12-10 – 2021-12-11 (×3): 3 mL via INTRAVENOUS

## 2021-12-10 MED ORDER — ACETAMINOPHEN 650 MG RE SUPP: 650.0000 mg | Freq: Four times a day (QID) | RECTAL | Status: DC | PRN

## 2021-12-10 MED ORDER — APIXABAN 5 MG PO TABS: 5.0000 mg | ORAL_TABLET | Freq: Two times a day (BID) | ORAL | Status: DC

## 2021-12-10 MED ORDER — KETOROLAC TROMETHAMINE 15 MG/ML IJ SOLN
15.0000 mg | Freq: Once | INTRAMUSCULAR | Status: AC
  Administered 2021-12-10: 15 mg via INTRAMUSCULAR
  Filled 2021-12-10: qty 1

## 2021-12-10 MED ORDER — HEPARIN (PORCINE) 25000 UT/250ML-% IV SOLN
1600.0000 [IU]/h | INTRAVENOUS | Status: DC
  Administered 2021-12-10: 1600 [IU]/h via INTRAVENOUS
  Filled 2021-12-10: qty 250

## 2021-12-10 MED ORDER — APIXABAN 5 MG PO TABS
10.0000 mg | ORAL_TABLET | Freq: Two times a day (BID) | ORAL | Status: DC
  Administered 2021-12-10 – 2021-12-11 (×2): 10 mg via ORAL
  Filled 2021-12-10 (×3): qty 2

## 2021-12-10 MED ORDER — ONDANSETRON HCL 4 MG/2ML IJ SOLN
4.0000 mg | Freq: Four times a day (QID) | INTRAMUSCULAR | Status: DC | PRN
Start: 2021-12-10 — End: 2021-12-11

## 2021-12-10 MED ORDER — ALBUTEROL SULFATE (2.5 MG/3ML) 0.083% IN NEBU: 2.5000 mg | INHALATION_SOLUTION | Freq: Four times a day (QID) | RESPIRATORY_TRACT | Status: DC | PRN

## 2021-12-10 MED ORDER — ONDANSETRON HCL 4 MG PO TABS: 4.0000 mg | ORAL_TABLET | Freq: Four times a day (QID) | ORAL | Status: DC | PRN

## 2021-12-10 MED ORDER — ACETAMINOPHEN 325 MG PO TABS
650.0000 mg | ORAL_TABLET | Freq: Four times a day (QID) | ORAL | Status: DC | PRN
Start: 2021-12-10 — End: 2021-12-11

## 2021-12-10 MED ORDER — NITROGLYCERIN 0.4 MG SL SUBL
0.4000 mg | SUBLINGUAL_TABLET | SUBLINGUAL | Status: DC | PRN
  Administered 2021-12-10: 0.4 mg via SUBLINGUAL
  Filled 2021-12-10: qty 1

## 2021-12-10 MED ORDER — HEPARIN BOLUS VIA INFUSION
5000.0000 [IU] | Freq: Once | INTRAVENOUS | Status: AC
  Administered 2021-12-10: 5000 [IU] via INTRAVENOUS
  Filled 2021-12-10: qty 5000

## 2021-12-10 MED ORDER — SODIUM CHLORIDE 0.9 % IV BOLUS
500.0000 mL | Freq: Once | INTRAVENOUS | Status: AC
  Administered 2021-12-10: 500 mL via INTRAVENOUS

## 2021-12-10 MED ORDER — HYDROCODONE-ACETAMINOPHEN 5-325 MG PO TABS: 1.0000 | ORAL_TABLET | ORAL | Status: DC | PRN

## 2021-12-10 NOTE — Consult Note (Signed)
?Cardiology Consultation:  ? ?Patient ID: Marcus EhrlichKenneth R Potts ?MRN: [id number]; DOB: [birth year] ? ?Admit date: 12/10/2021 ?Date of Consult: 12/10/2021 ? ?PCP:  Pcp, No ?  ?[facility] Providers ?Cardiologist:  None      ? ? ?Patient Profile:  ? ?Marcus Potts is a 56 y.o. male with a hx of PE who is being seen 12/10/2021 for the evaluation of chest pain/abnormal EKG at the request of Dr. Katrinka BlazingSmith. ? ?History of Present Illness:  ? ?Mr. 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  Sharp pleuritic type worse with deep breath difficulty laying flat.  Pack a day of cigarettes. ? ?Back in January 2023 he was found to have bilateral PE.  Started on Eliquis.  No DVT at that time.  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 ? ?He was hypoxic on arrival 88% on room air.  A CT scan of the chest showed bilateral acute appearing PE with acute emboli of the right lung as well.  EKG was concerning for biphasic T waves especially in the early precordial leads.  Unfortunately we do not have visualization of a prior EKG but we do have interpretation of prior EKGs which show LVH with repolarization abnormality ? ?He was started on heparin drip and admitted. ? ?Currently feeling comfortable in bed.  Getting lower extremity Dopplers as we speak. ? ? ? ?Past Medical History:  ?Diagnosis Date  ? Glaucoma   ? PTSD (post-traumatic stress disorder)   ? ? ?History reviewed. No pertinent surgical history.  ? ?Home Medications:  ?Prior to Admission medications   ?Medication Sig Start Date End Date Taking? Authorizing Provider  ?apixaban (ELIQUIS) 5 MG TABS tablet Take 5 mg by mouth daily.   Yes [provider]  ? ? ?Inpatient Medications: ?Scheduled Meds: ? sodium chloride flush  3 mL Intravenous Q12H  ? ?Continuous Infusions: ? heparin 1,600 Units/hr (12/10/21 1312)  ? ?PRN Meds: ?acetaminophen **OR** acetaminophen, albuterol,  HYDROcodone-acetaminophen, nitroGLYCERIN, ondansetron **OR** ondansetron (ZOFRAN) IV ? ?Allergies:   No Known Allergies ? ?Social History:   ?Social History  ? ?Socioeconomic History  ? Marital status: Single  ?  Spouse name: Not on file  ? Number of children: Not on file  ? Years of education: Not on file  ? Highest education level: Not on file  ?Occupational History  ? Not on file  ?Tobacco Use  ? Smoking status: Every Day  ? Smokeless tobacco: Not on file  ?Substance and Sexual Activity  ? Alcohol use: Yes  ? Drug use: No  ? Sexual activity: Not on file  ?Other Topics Concern  ? Not on file  ?Social History Narrative  ? Not on file  ? ?Social Determinants of Health  ? ?Financial Resource Strain: Not on file  ?Food Insecurity: Not on file  ?Transportation Needs: Not on file  ?Physical Activity: Not on file  ?Stress: Not on file  ?Social Connections: Not on file  ?Intimate Partner Violence: Not on file  ?  ?Family History:   ?History reviewed. No pertinent family history.  ? ?ROS:  ?Please see the history of present illness.  ?No bleeding no syncope ?All other ROS reviewed and negative.    ? ?Physical Exam/Data:  ? ?Vitals:  ? 12/10/21 1400 12/10/21 1415 12/10/21 1438 12/10/21 1454  ?BP: 112/78 113/80  127/80  ?Pulse:    (!) 50  ?Resp: 20 16  18   ?  Temp:      ?SpO2:   99% 99%  ?Weight:    102.1 kg  ?Height:    6\' 3"  (1.905 m)  ? ?No intake or output data in the 24 hours ending 12/10/21 1555 ? ?  12/10/2021  ?  2:54 PM 05/10/2014  ? 11:01 AM  ?Last 3 Weights  ?Weight (lbs) 225 lb 225 lb  ?Weight (kg) 102.059 kg 102.059 kg  ?   ?Body mass index is 28.12 kg/m?.  ?General:  Well nourished, well developed, in no acute distress ?HEENT: normal ?Neck: no JVD ?Vascular: No carotid bruits; Distal pulses 2+ bilaterally ?Cardiac:  normal S1, S2; RRR; no murmur  ?Lungs:  clear to auscultation bilaterally, no wheezing, rhonchi or rales  ?Abd: soft, nontender, no hepatomegaly  ?Ext: no edema ?Musculoskeletal:  No deformities, BUE  and BLE strength normal and equal ?Skin: warm and dry  ?Neuro:  CNs 2-12 intact, no focal abnormalities noted ?Psych:  Normal affect  ? ?EKG:  The EKG was personally reviewed and demonstrates:  as below ?Telemetry:  Telemetry was personally reviewed and demonstrates: No adverse arrhythmias ? ?Relevant CV Studies: ? ?Echocardiogram 12/10/2021 ? ? 1. Left ventricular ejection fraction, by estimation, is 60 to 65%. The  ?left ventricle has normal function. The left ventricle has no regional  ?wall motion abnormalities. Left ventricular diastolic parameters were  ?normal.  ? 2. Right ventricular systolic function is normal. The right ventricular  ?size is normal.  ? 3. The mitral valve is normal in structure. No evidence of mitral valve  ?regurgitation. No evidence of mitral stenosis.  ? 4. The aortic valve is tricuspid. There is mild calcification of the  ?aortic valve. Aortic valve regurgitation is mild. Aortic valve sclerosis  ?is present, with no evidence of aortic valve stenosis.  ? 5. The inferior vena cava is normal in size with greater than 50%  ?respiratory variability, suggesting right atrial pressure of 3 mmHg.  ? ?Laboratory Data: ? ?High Sensitivity Troponin:   ?Recent Labs  ?Lab 12/10/21 ?0343 12/10/21 ?0826  ?TROPONINIHS 8 6  ?   ?Chemistry ?Recent Labs  ?Lab 12/10/21 ?0343  ?NA 140  ?K 3.7  ?CL 108  ?CO2 25  ?GLUCOSE 140*  ?BUN 13  ?CREATININE 1.27*  ?CALCIUM 9.1  ?GFRNONAA >60  ?ANIONGAP 7  ?  ?No results for input(s): PROT, ALBUMIN, AST, ALT, ALKPHOS, BILITOT in the last 168 hours. ?Lipids No results for input(s): CHOL, TRIG, HDL, LABVLDL, LDLCALC, CHOLHDL in the last 168 hours.  ?Hematology ?Recent Labs  ?Lab 12/10/21 ?0343  ?WBC 4.7  ?RBC 4.58  ?HGB 14.2  ?HCT 41.2  ?MCV 90.0  ?MCH 31.0  ?MCHC 34.5  ?RDW 13.7  ?PLT 215  ? ?Thyroid No results for input(s): TSH, FREET4 in the last 168 hours.  ?BNPNo results for input(s): BNP, PROBNP in the last 168 hours.  ?DDimer No results for input(s): DDIMER in the  last 168 hours. ? ? ?Radiology/Studies:  ?DG Chest 2 View ? ?Result Date: 12/10/2021 ?CLINICAL DATA:  Chest pain, bilateral lower extremity swelling. EXAM: CHEST - 2 VIEW COMPARISON:  None. FINDINGS: The heart is enlarged the mediastinal contour is within normal limits. Atherosclerotic calcification of the aorta is noted. Lung volumes are low with mild atelectasis at the lung bases. No consolidation, effusion, or pneumothorax. No acute osseous abnormality. IMPRESSION: 1. Cardiomegaly. 2. Low lung volumes with atelectasis at the lung bases. Electronically Signed   By: 12/12/2021 M.D.   On: 12/10/2021 04:11  ? ?  CT Angio Chest PE W and/or Wo Contrast ? ?Result Date: 12/10/2021 ?CLINICAL DATA:  Pulmonary embolism (PE) suspected, high prob EXAM: CT ANGIOGRAPHY CHEST WITH CONTRAST TECHNIQUE: Multidetector CT imaging of the chest was performed using the standard protocol during bolus administration of intravenous contrast. Multiplanar CT image reconstructions and MIPs were obtained to evaluate the vascular anatomy. RADIATION DOSE REDUCTION: This exam was performed according to the departmental dose-optimization program which includes automated exposure control, adjustment of the mA and/or kV according to patient size and/or use of iterative reconstruction technique. CONTRAST:  50mL OMNIPAQUE IOHEXOL 350 MG/ML SOLN COMPARISON:  None. FINDINGS: Cardiovascular: Satisfactory opacification of the pulmonary arteries to the segmental level. There are linear nonocclusive filling defects in several branches including left lower lobe lobar branch, distal right main pulmonary artery, and right lower lobe segmental branches. Marked lucent filling defect is present within a right lower lobe segmental branch. Normal heart size. No evidence of right heart strain. No pericardial effusion. Mediastinum/Nodes: Normal thyroid. No enlarged nodes. Esophagus is unremarkable. Lungs/Pleura: Bibasilar atelectasis. No pleural effusion or  pneumothorax. Upper Abdomen: No acute abnormality. Musculoskeletal: No acute or significant osseous abnormality. Review of the MIP images confirms the above findings. IMPRESSION: Pulmonary emboli are present. Many o

## 2021-12-10 NOTE — ED Notes (Signed)
Patient transported to CT 

## 2021-12-10 NOTE — H&P (Addendum)
?History and Physical  ? ? ?Patient: Marcus [facility] XIP:[id number] DOB: Jul 19, 1966 ?DOA: 12/10/2021 ?DOS: the patient was seen and examined on 12/10/2021 ?PCP: Pcp, No  ?Patient coming from: Home ? ?Chief Complaint:  ?Chief Complaint  ?Patient presents with  ? Chest Pain  ? ?HPI: Marcus [facility] is a 56 y.o. male with medical history significant of pulmonary embolism on Eliquis and tobacco abuse who presents with complaints of chest pain and S of breath over the last couple of days.  He reports pain is substernal, sharp, and pleuritic in nature.  Pain radiates to the left side of his back.  Patient states that he has had a difficult time laying flat at night, but reports this has been ongoing for 4 years or so.  Associated symptoms include intermittent leg swelling and cough.  He does smoke a pack of cigarettes per day on average and has done so for approximately 30 years.  Previously admitted in 08/2021 at [facility] health found to have concern for bilateral pulmonary emboli.  He was started on Eliquis and at that time Doppler ultrasound of the lower extremities did not show any signs of a DVT.  Since that time patient reports intermittent compliance usually only taking the medication once per day.  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   ? ?Upon admission into the emergency department patient was seen to be afebrile with pulse 52-69, respirations 1123, blood pressures currently maintained, and O2 saturations as low as 88% on room air.  Labs significant for BUN 13, creatinine 1.27, high-sensitivity troponins negative x2.  Chest x-ray noted cardiomegaly.  CTA of the chest significant for bilateral acute appearing pulmonary emboli and acute emboli of the right lung.  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  Case was discussed with cardiology who recommended medicine admission.  Patient had been given 500 mL bolus of IV fluids, ketorolac 15 mg IM, and started on heparin  drip ? ?Review of Systems: As mentioned in the history of present illness. All other systems reviewed and are negative. ?Past Medical History:  ?Diagnosis Date  ? Glaucoma   ? PTSD (post-traumatic stress disorder)   ? ?History reviewed. No pertinent surgical history. ?Social History:  reports that he has been smoking. He does not have any smokeless tobacco history on file. He reports current alcohol use. He reports that he does not use drugs. ? ?No Known Allergies ? ?History reviewed. No pertinent family history. ? ?Prior to Admission medications   ?Medication Sig Start Date End Date Taking? Authorizing Provider  ?apixaban (ELIQUIS) 5 MG TABS tablet Take 5 mg by mouth daily.   Yes [provider]  ? ? ?Physical Exam: ?Vitals:  ? 12/10/21 1100 12/10/21 1115 12/10/21 1144 12/10/21 1215  ?BP: 116/72 109/84 113/84 116/83  ?Pulse:   (!) 56 (!) 52  ?Resp: 11 17 17 19   ?Temp:      ?SpO2:   100% (!) 88%  ? ? ?Constitutional: Middle-age male who appears to be in some discomfort ?Eyes: PERRL, lids and conjunctivae normal ?ENMT: Mucous membranes are moist. Posterior pharynx clear of any exudate or lesions.  ?Neck: normal, supple,    ?Respiratory: Decreased overall aeration without significant wheezes or rhonchi appreciated.  O2 saturation currently maintained on room air. ?Cardiovascular: Regular rate and rhythm, no murmurs / rubs / gallops. No extremity edema. 2+ pedal pulses. No carotid bruits.  ?Abdomen: no tenderness, no masses palpated.  Bowel sounds appreciated. ?Musculoskeletal: no  clubbing / cyanosis. No joint deformity upper and lower extremities. Good ROM, no contractures. Normal muscle tone.  ?Skin: no rashes, lesions, ulcers. No induration ?Neurologic: CN 2-12 grossly intact. Strength 5/5 in all 4.  ?Psychiatric: Normal judgment and insight. Alert and oriented x 3. Normal mood.  ? ?Data Reviewed: ? ?EKGs reveal evolving findings concerning for ST elevation in the lateral leads ? ?Assessment and  Plan: ?Pulmonary embolus history of pulmonary embolus ?Acute.  Patient presents with complaints of chest pain.  High-sensitivity troponins negative x2.  Prior history of pulmonary embolus in 08/2021 for which patient was placed on Eliquis.  However, patient with intermittent compliance of Eliquis.  High-sensitivity troponins negative x2.  CTA of the chest noted acute on chronic pulmonary emboli.  Patient started on heparin drip.  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 ?-Admit to a cardiac telemetry bed ?-Strict bedrest for at least 24 hours ?-Incentive spirometer ?-Check Doppler ultrasound of the lower extremities and echocardiogram ?-Continue heparin drip per pharmacy ?-Hydrocodone as needed for pain ?-Continue to stress need of compliance with Eliquis. ? ?Abnormal EKG ?EKG ordered to show concern for Wellens syndrome.  High-sensitivity troponins were negative. ?-Follow-up echocardiogram ? ?Acute kidney injury ?On admission creatinine 1.27 with BUN 13, patient's creatinine previously had been 0.9 on 08/2021 per review of records on care everywhere. ?-Check urinalysis ?-Possibly related with heart function ? ?Tobacco abuse ?Patient with a approximately 30 smoking pack-year history.  Patient declines need of nicotine patch.   ?-Counseled patient on need of cessation of tobacco use. ? ? ?Advance Care Planning:   Code Status: Full Code   ? ?Consults: Cardiology ? ?Family Communication: None requested ? ?Severity of Illness: ?The appropriate patient status for this patient is OBSERVATION. Observation status is judged to be reasonable and necessary in order to provide the required intensity of service to ensure the patient's safety. The patient's presenting symptoms, physical exam findings, and initial radiographic and laboratory data in the context of their medical condition is felt to place them at decreased risk for further clinical  deterioration. Furthermore, it is anticipated that the patient will be medically stable for discharge from the hospital within 2 midnights of admission.  ? ?Author: ?Clydie Braun, MD ?12/10/2021 12:25 PM ? ?For on call review [email or web address].  ?

## 2021-12-10 NOTE — Progress Notes (Signed)
Lower extremity venous has been completed.  ? ?Preliminary results in CV Proc.  ? ?Marcus [facility] Marcus [facility]otts ?12/10/2021 3:57 PM    ?

## 2021-12-10 NOTE — Progress Notes (Signed)
?  Echocardiogram ?2D Echocardiogram has been performed. ? ?Marcus [facility]otts ?12/10/2021, 2:41 PM ?

## 2021-12-10 NOTE — Progress Notes (Signed)
ANTICOAGULATION CONSULT NOTE - Initial Consult ? ?Pharmacy Consult for heparin>>apixaban ?Indication: pulmonary embolus ? ?No Known Allergies ? ?Patient Measurements: ?Height: 6\' 3"  (190.5 cm) ?Weight: 102.1 kg (225 lb) ?IBW/kg (Calculated) : 84.5 ?Heparin Dosing Weight: 93 kg  ? ?Vital Signs: ?Temp: 98.9 ?F (37.2 ?C) (04/29 1947) ?Temp Source: Oral (04/29 1947) ?BP: 122/73 (04/29 1947) ?Pulse Rate: 69 (04/29 1947) ? ?Labs: ?Recent Labs  ?  12/10/21 ?0343 12/10/21 ?0826  ?HGB 14.2  --   ?HCT 41.2  --   ?PLT 215  --   ?CREATININE 1.27*  --   ?TROPONINIHS 8 6  ? ? ? ?Estimated Creatinine Clearance: 87.1 mL/min (A) (by C-G formula based on SCr of 1.27 mg/dL (H)). ? ? ?Medical History: ?Past Medical History:  ?Diagnosis Date  ? Glaucoma   ? PTSD (post-traumatic stress disorder)   ? ? ?Medications:  ?Medications Prior to Admission  ?Medication Sig Dispense Refill Last Dose  ? apixaban (ELIQUIS) 5 MG TABS tablet Take 5 mg by mouth daily.   Past Month  ? ? ?Assessment: ?54 YOM with h/o PE on Eliquis with intermittent compliance found to have a new PE. He reports last dose of Eliquis was sometime last month. Pharmacy consulted to start IV heparin ? ?New thrombus is largely due to adherence and long term sitting for job. D/w Dr Tamala Julian tonight and we will transition him back to apixaban. We will counsel on adherence before discharge.  ? ?Goal of Therapy:  ?Monitor platelets by anticoagulation protocol: Yes ?  ?Plan:  ?Dc heparin ?Apixaban 10mg  BID x7 days then 5mg  BID ?Rx will counsel and follow peripherally ? ?Onnie Boer, PharmD, BCIDP, AAHIVP, CPP ?Infectious Disease Pharmacist ?12/10/2021 8:09 PM ? ? ? ? ? ?

## 2021-12-10 NOTE — Plan of Care (Signed)

## 2021-12-10 NOTE — ED Provider Notes (Signed)
?[facility] ?Provider Note ? ? ?CSN: [id number] ?Arrival date & time: 12/10/21  0319 ? ?  ? ?History ? ?Chief Complaint  ?Patient presents with  ? Chest Pain  ? ? ?Marcus EhrlichKenneth R Potts is a 56 y.o. male. ? ?56 year old male presents today for evaluation of chest pain as of this morning.  He denies associated shortness of breath.  Describes chest pain as pleuritic.  He has history of PE for which she was admitted to [facility] and discharged on anticoagulation.  He reports he has been taking his Eliquis once a day.  He is unsure of his last dose.  He also states over the past several weeks that he has had intermittent swelling of bilateral lower extremities. ? ?The history is provided by the patient. No language interpreter was used.  ? ?  ? ?Home Medications ?Prior to Admission medications   ?Medication Sig Start Date End Date Taking? Authorizing Provider  ?apixaban (ELIQUIS) 5 MG TABS tablet Take 5 mg by mouth daily.   Yes [provider]  ?   ? ?Allergies    ?Patient has no known allergies.   ? ?Review of Systems   ?Review of Systems  ?Constitutional:  Negative for chills and fever.  ?Respiratory:  Negative for shortness of breath.   ?Cardiovascular:  Positive for chest pain and leg swelling. Negative for palpitations.  ?Gastrointestinal:  Negative for abdominal pain, nausea and vomiting.  ?Genitourinary:  Negative for difficulty urinating.  ?Neurological:  Negative for light-headedness.  ?All other systems reviewed and are negative. ? ?Physical Exam ?Updated Vital Signs ?BP 116/83   Pulse (!) 52   Temp 97.9 ?F (36.6 ?C)   Resp 19   SpO2 (!) 88%  ?Physical Exam ?Vitals and nursing note reviewed.  ?Constitutional:   ?   General: He is not in acute distress. ?   Appearance: Normal appearance. He is not ill-appearing.  ?HENT:  ?   Head: Normocephalic and atraumatic.  ?   Nose: Nose normal.  ?Eyes:  ?   General: No scleral icterus. ?   Extraocular Movements: Extraocular  movements intact.  ?   Conjunctiva/sclera: Conjunctivae normal.  ?Cardiovascular:  ?   Rate and Rhythm: Regular rhythm. Bradycardia present.  ?   Pulses: Normal pulses.  ?Pulmonary:  ?   Effort: Pulmonary effort is normal. No respiratory distress.  ?   Breath sounds: Normal breath sounds. No wheezing or rales.  ?Abdominal:  ?   General: There is no distension.  ?   Tenderness: There is no abdominal tenderness.  ?Musculoskeletal:     ?   General: Normal range of motion.  ?   Cervical back: Normal range of motion.  ?   Right lower leg: No edema.  ?   Left lower leg: No edema.  ?Skin: ?   General: Skin is warm and dry.  ?Neurological:  ?   General: No focal deficit present.  ?   Mental Status: He is alert. Mental status is at baseline.  ? ? ?ED Results / Procedures / Treatments   ?Labs ?(all labs ordered are listed, but only abnormal results are displayed) ?Labs Reviewed  ?BASIC METABOLIC PANEL - Abnormal; Notable for the following components:  ?    Result Value  ? Glucose, Bld 140 (*)   ? Creatinine, Ser 1.27 (*)   ? All other components within normal limits  ?CBC  ?TROPONIN I (HIGH SENSITIVITY)  ?TROPONIN I (HIGH SENSITIVITY)  ? ? ?EKG ?  EKG Interpretation ? ?Date/Time:  Saturday December 10 2021 09:45:46 EDT ?Ventricular Rate:  63 ?PR Interval:  141 ?QRS Duration: 87 ?QT Interval:  431 ?QTC Calculation: 442 ?R Axis:   59 ?Text Interpretation: Sinus rhythm RSR' in V1 or V2, right VCD or RVH Consider left ventricular hypertrophy Abnormal T, consider ischemia, lateral leads ST elevation, consider lateral injury Confirmed by Tanda Rockers ([id number]) on 12/10/2021 10:38:10 AM ? ?Radiology ?DG Chest 2 View ? ?Result Date: 12/10/2021 ?CLINICAL DATA:  Chest pain, bilateral lower extremity swelling. EXAM: CHEST - 2 VIEW COMPARISON:  None. FINDINGS: The heart is enlarged the mediastinal contour is within normal limits. Atherosclerotic calcification of the aorta is noted. Lung volumes are low with mild atelectasis at the lung bases. No  consolidation, effusion, or pneumothorax. No acute osseous abnormality. IMPRESSION: 1. Cardiomegaly. 2. Low lung volumes with atelectasis at the lung bases. Electronically Signed   By: Thornell Sartorius M.D.   On: 12/10/2021 04:11  ? ?CT Angio Chest PE W and/or Wo Contrast ? ?Result Date: 12/10/2021 ?CLINICAL DATA:  Pulmonary embolism (PE) suspected, high prob EXAM: CT ANGIOGRAPHY CHEST WITH CONTRAST TECHNIQUE: Multidetector CT imaging of the chest was performed using the standard protocol during bolus administration of intravenous contrast. Multiplanar CT image reconstructions and MIPs were obtained to evaluate the vascular anatomy. RADIATION DOSE REDUCTION: This exam was performed according to the departmental dose-optimization program which includes automated exposure control, adjustment of the mA and/or kV according to patient size and/or use of iterative reconstruction technique. CONTRAST:  71mL OMNIPAQUE IOHEXOL 350 MG/ML SOLN COMPARISON:  None. FINDINGS: Cardiovascular: Satisfactory opacification of the pulmonary arteries to the segmental level. There are linear nonocclusive filling defects in several branches including left lower lobe lobar branch, distal right main pulmonary artery, and right lower lobe segmental branches. Marked lucent filling defect is present within a right lower lobe segmental branch. Normal heart size. No evidence of right heart strain. No pericardial effusion. Mediastinum/Nodes: Normal thyroid. No enlarged nodes. Esophagus is unremarkable. Lungs/Pleura: Bibasilar atelectasis. No pleural effusion or pneumothorax. Upper Abdomen: No acute abnormality. Musculoskeletal: No acute or significant osseous abnormality. Review of the MIP images confirms the above findings. IMPRESSION: Pulmonary emboli are present. Many of the filling defects are nonocclusive and some could be non-acute. This includes small thrombus within the distal right main pulmonary artery. There is occlusive filling of a right  lower lobe segmental branch, which is more likely to be acute. These results were called by telephone at the time of interpretation on 12/10/2021 at 10:43 am to provider Dr. Wallace Cullens, who verbally acknowledged these results. Electronically Signed   By: Guadlupe Spanish M.D.   On: 12/10/2021 10:53   ? ?Procedures ?Procedures  ? ? ?Medications Ordered in ED ?Medications  ?nitroGLYCERIN (NITROSTAT) SL tablet 0.4 mg (0.4 mg Sublingual Given 12/10/21 1104)  ?heparin bolus via infusion 5,000 Units (has no administration in time range)  ?heparin ADULT infusion 100 units/mL (25000 units/268mL) (has no administration in time range)  ?ketorolac (TORADOL) 15 MG/ML injection 15 mg (15 mg Intramuscular Given 12/10/21 0949)  ?sodium chloride 0.9 % bolus 500 mL (0 mLs Intravenous Stopped 12/10/21 1130)  ?iohexol (OMNIPAQUE) 350 MG/ML injection 80 mL (80 mLs Intravenous Contrast Given 12/10/21 1022)  ? ? ?ED Course/ Medical Decision Making/ A&P ?Clinical Course as of 12/10/21 1224  ?Sat Dec 10, 2021  ?1157 Repeat troponin is 6.  Repeat EKG does show evolving changes.  Concerning for Wellens syndrome.  This was discussed with Dr. Anne Fu.  Who also notes these concerning EKG changes and recommends admission for observation and echo.  CT chest does show multiple subsegmental PEs which are nonocclusive.  Will place patient on heparin drip and discussed with hospitalist for admission. [AA]  ?  ?Clinical Course User Index ?Verne Grain, PA-C  ? ?                        ?Medical Decision Making ?Amount and/or Complexity of Data Reviewed ?Labs: ordered. ?Radiology: ordered. ? ?Risk ?Prescription drug management. ?Decision regarding hospitalization. ? ? ?56 year old male presents today for evaluation of chest pain.  He has history of PE and was started on Eliquis.  Patient is noncompliant with his Eliquis.  Currently reports a 6 out of 10 chest pain.  Toradol provided with mild improvement.  Nitro provided with mild improvement as well.  Work-up  shows unremarkable CBC, BMP with mild renal insufficiency with creatinine 1.27, and glucose of 140 otherwise unremarkable.  Troponin of 8.  Will repeat troponin, repeat EKG, and evaluate with CT angio chest for

## 2021-12-10 NOTE — Progress Notes (Signed)
ANTICOAGULATION CONSULT NOTE - Initial Consult ? ?Pharmacy Consult for heparin ?Indication: pulmonary embolus ? ?No Known Allergies ? ?Patient Measurements: ?  ?Heparin Dosing Weight: 93 kg  ? ?Vital Signs: ?Temp: 97.9 ?F (36.6 ?C) (04/29 0335) ?BP: 113/84 (04/29 1144) ?Pulse Rate: 56 (04/29 1144) ? ?Labs: ?Recent Labs  ?  12/10/21 ?0343 12/10/21 ?0826  ?HGB 14.2  --   ?HCT 41.2  --   ?PLT 215  --   ?CREATININE 1.27*  --   ?TROPONINIHS 8 6  ? ? ?CrCl cannot be calculated (Unknown ideal weight.). ? ? ?Medical History: ?Past Medical History:  ?Diagnosis Date  ? Glaucoma   ? PTSD (post-traumatic stress disorder)   ? ? ?Medications:  ?(Not in a hospital admission)  ? ?Assessment: ?40 YOM with h/o PE on Eliquis with intermittent compliance found to have a new PE. He reports last dose of Eliquis was sometime last month. Pharmacy consulted to start IV heparin ? ?H/H and Plt wnl. SCr mildly elevated ? ?Goal of Therapy:  ?Heparin level 0.3-0.7 units/ml ?Monitor platelets by anticoagulation protocol: Yes ?  ?Plan:  ?-Heparin 5000 units IV bolus followed by heparin infusion at 1600 units/hr ?-F/u 6 hr aPTT/HL  ?-Monitor daily HL, aPTT, CBC and s/s of bleeding  ? ?Vinnie Level, PharmD., BCCCP ?Clinical Pharmacist ?Please refer to [facility] for unit-specific pharmacist  ? ? ? ?

## 2021-12-10 NOTE — ED Triage Notes (Signed)
Pt reported to ED with c/o central chest pain that radiates into back x4 hrs. Pt states pain is sharp, denies SHOB/. Pt also staes he has swelling to BLE, onset began 4hr ago, at same time of chest pain. Denies hx of similar events. Also complains of some headache, denies any dizziness.  ?

## 2021-12-11 DIAGNOSIS — F17200 Nicotine dependence, unspecified, uncomplicated: Secondary | ICD-10-CM | POA: Diagnosis not present

## 2021-12-11 DIAGNOSIS — Z720 Tobacco use: Secondary | ICD-10-CM | POA: Diagnosis not present

## 2021-12-11 DIAGNOSIS — I2699 Other pulmonary embolism without acute cor pulmonale: Secondary | ICD-10-CM | POA: Diagnosis not present

## 2021-12-11 DIAGNOSIS — N179 Acute kidney failure, unspecified: Secondary | ICD-10-CM | POA: Diagnosis not present

## 2021-12-11 DIAGNOSIS — R9431 Abnormal electrocardiogram [ECG] [EKG]: Secondary | ICD-10-CM | POA: Diagnosis not present

## 2021-12-11 LAB — CBC
HCT: 38.4 % — ABNORMAL LOW (ref 39.0–52.0)
Hemoglobin: 13.2 g/dL (ref 13.0–17.0)
MCH: 30.9 pg (ref 26.0–34.0)
MCHC: 34.4 g/dL (ref 30.0–36.0)
MCV: 89.9 fL (ref 80.0–100.0)
Platelets: 189 10*3/uL (ref 150–400)
RBC: 4.27 MIL/uL (ref 4.22–5.81)
RDW: 13.8 % (ref 11.5–15.5)
WBC: 4 10*3/uL (ref 4.0–10.5)
nRBC: 0 % (ref 0.0–0.2)

## 2021-12-11 MED ORDER — APIXABAN (ELIQUIS) VTE STARTER PACK (10MG AND 5MG): ORAL_TABLET | ORAL | 0 refills | Status: AC

## 2021-12-11 NOTE — Discharge Summary (Signed)
Physician Discharge Summary  ?Marcus Potts WUX:[id number] DOB: [birth year] DOA: 12/10/2021 ? ?PCP: Pcp, No ? ?Admit date: 12/10/2021 ?Discharge date: 12/11/2021 ? ?Admitted From: Home ?Disposition: Home ? ?Recommendations for Outpatient Follow-up:  ?Follow up with PCP in 1-2 weeks ?Take your blood thinners uninterrupted. ? ?Home Health: N/A ?Equipment/Devices: N/A ? ?Discharge Condition: Stable ?CODE STATUS: Full code ?Diet recommendation: Regular diet ? 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  Lately he has been taking Eliquis 5 mg daily once a day and also missing 2 or 3 doses in a week.  He was on the way to [facility], started having substernal sharp pleuritic chest pain so checked into the emergency room.  In the emergency room afebrile, 88% on room air.  CT angiogram showed bilateral acute pulmonary embolism, also with evidence of chronic pulmonary embolism.  EKG showing some nonspecific changes including biphasic T waves and early precordial leads. ? ?CT angiogram with bilateral acute on chronic PE ?2D echocardiogram with normal ejection fraction, no intracardiac thrombus ?Lower extremity duplexes with age-indeterminate DVT on the left peroneal veins. ?Reviewed recent admission to [facility] with hypercoagulable work-up negative. ?Admitted overnight treated with heparin, chest pain improved.  Mobilizing around. ? ?Plan: ?Extensively educated to take appropriate dose of Eliquis.  He will resume starter pack with loading dose of Eliquis and continue maintenance dose.  New prescriptions were created so that he can get his supplies from [place].  No evidence of Eliquis failure as he was taking about 25% of the required medication. ?He should take anticoagulant uninterrupted for at least 6 months and also lifetime if he continues to drive long  distances. ?Chest pain has improved.  Cardiology has seen the patient and they thought EKG changes were benign. ?Patient will follow-up at [facility]. ? ? ?Discharge Diagnoses:  ?Principal Problem: ?  Pulmonary embolus (HCC) ?Active Problems: ?  Abnormal EKG ?  Tobacco abuse ?  AKI (acute kidney injury) (HCC) ?  History of pulmonary embolus (PE) ? ? ? ?Discharge Instructions ? ?Discharge Instructions   ? ? Call MD for:  difficulty breathing, headache or visual disturbances   Complete by: As directed ?  ? Call MD for:  severe uncontrolled pain   Complete by: As directed ?  ? Diet general   Complete by: As directed ?  ? Discharge instructions   Complete by: As directed ?  ? Take blood thinners without interruptions as prescribed.  ? Increase activity slowly   Complete by: As directed ?  ? ?  ? ?Allergies as of 12/11/2021   ?No Known Allergies ?  ? ?  ?Medication List  ?  ? ?STOP taking these medications   ? ?apixaban 5 MG Tabs tablet ?Commonly known as: ELIQUIS ?Replaced by: Apixaban Starter Pack (10mg  and 5mg ) ?  ? ?  ? ?TAKE these medications   ? ?Apixaban Starter Pack (10mg  and 5mg ) ?Commonly known as: ELIQUIS STARTER PACK ?Take as directed on package: start with two-5mg  tablets twice daily for 7 days. On day 8, switch to one-5mg  tablet twice daily. ?Replaces: apixaban 5 MG Tabs tablet ?  ? ?  ? ? ?No Known Allergies ? ?Consultations: ?Cardiology ? ? ?Procedures/Studies: ?DG Chest 2 View ? ?Result Date: 12/10/2021 ?CLINICAL DATA:  Chest pain, bilateral lower extremity swelling. EXAM: CHEST - 2 VIEW COMPARISON:  None. FINDINGS: The heart is enlarged the mediastinal contour is  within normal limits. Atherosclerotic calcification of the aorta is noted. Lung volumes are low with mild atelectasis at the lung bases. No consolidation, effusion, or pneumothorax. No acute osseous abnormality. IMPRESSION: 1. Cardiomegaly. 2. Low lung volumes with atelectasis at the lung bases. Electronically Signed   By: Thornell Sartorius M.D.    On: 12/10/2021 04:11  ? ?CT Angio Chest PE W and/or Wo Contrast ? ?Result Date: 12/10/2021 ?CLINICAL DATA:  Pulmonary embolism (PE) suspected, high prob EXAM: CT ANGIOGRAPHY CHEST WITH CONTRAST TECHNIQUE: Multidetector CT imaging of the chest was performed using the standard protocol during bolus administration of intravenous contrast. Multiplanar CT image reconstructions and MIPs were obtained to evaluate the vascular anatomy. RADIATION DOSE REDUCTION: This exam was performed according to the departmental dose-optimization program which includes automated exposure control, adjustment of the mA and/or kV according to patient size and/or use of iterative reconstruction technique. CONTRAST:  65mL OMNIPAQUE IOHEXOL 350 MG/ML SOLN COMPARISON:  None. FINDINGS: Cardiovascular: Satisfactory opacification of the pulmonary arteries to the segmental level. There are linear nonocclusive filling defects in several branches including left lower lobe lobar branch, distal right main pulmonary artery, and right lower lobe segmental branches. Marked lucent filling defect is present within a right lower lobe segmental branch. Normal heart size. No evidence of right heart strain. No pericardial effusion. Mediastinum/Nodes: Normal thyroid. No enlarged nodes. Esophagus is unremarkable. Lungs/Pleura: Bibasilar atelectasis. No pleural effusion or pneumothorax. Upper Abdomen: No acute abnormality. Musculoskeletal: No acute or significant osseous abnormality. Review of the MIP images confirms the above findings. IMPRESSION: Pulmonary emboli are present. Many of the filling defects are nonocclusive and some could be non-acute. This includes small thrombus within the distal right main pulmonary artery. There is occlusive filling of a right lower lobe segmental branch, which is more likely to be acute. These results were called by telephone at the time of interpretation on 12/10/2021 at 10:43 am to provider Dr. Wallace Cullens, who verbally acknowledged  these results. Electronically Signed   By: Guadlupe Spanish M.D.   On: 12/10/2021 10:53  ? ?ECHOCARDIOGRAM COMPLETE ? ?Result Date: 12/10/2021 ?   ECHOCARDIOGRAM REPORT   Patient Name:   KANDON HOSKING Date of Exam: 12/10/2021 Medical Rec #:  [id number]          Height:       76.0 in Accession #:    [id number]         Weight:       225.0 lb Date of Birth:  [birth year]         BSA:          2.329 m? Patient Age:    55 years           BP:           116/83 mmHg Patient Gender: M                  HR:           57 bpm. Exam Location:  Inpatient Procedure: 2D Echo, Cardiac Doppler and Color Doppler                       STAT ECHO Reported to: Dr Charlton Haws on 12/10/2021 2:38:00 PM. Indications:    Pulmonary embolus  History:        Patient has no prior history of Echocardiogram examinations.                 Risk Factors:Current Smoker.  Sonographer:  Ross LudwigArthur Guy RDCS (AE) Referring Phys: [id number] RONDELL A SMITH IMPRESSIONS  1. Left ventricular ejection fraction, by estimation, is 60 to 65%. The left ventricle has normal function. The left ventricle has no regional wall motion abnormalities. Left ventricular diastolic parameters were normal.  2. Right ventricular systolic function is normal. The right ventricular size is normal.  3. The mitral valve is normal in structure. No evidence of mitral valve regurgitation. No evidence of mitral stenosis.  4. The aortic valve is tricuspid. There is mild calcification of the aortic valve. Aortic valve regurgitation is mild. Aortic valve sclerosis is present, with no evidence of aortic valve stenosis.  5. The inferior vena cava is normal in size with greater than 50% respiratory variability, suggesting right atrial pressure of 3 mmHg. FINDINGS  Left Ventricle: Left ventricular ejection fraction, by estimation, is 60 to 65%. The left ventricle has normal function. The left ventricle has no regional wall motion abnormalities. The left ventricular internal cavity size was normal in  size. There is  no left ventricular hypertrophy. Left ventricular diastolic parameters were normal. Right Ventricle: The right ventricular size is normal. No increase in right ventricular wall thickness. Right v

## 2021-12-11 NOTE — Care Management (Signed)
Provided with 30 day Eliquis card. He states he also has [facility] and Medicaid for refills.  ?

## 2021-12-11 NOTE — Progress Notes (Signed)
Patient discharged to home. IV and telemetry removed. Discharge instructions explained and given to patient. ?

## 2021-12-11 NOTE — Plan of Care (Signed)

## 2023-01-19 IMAGING — CT CT ANGIO CHEST
2 of 7 series · 18 of 46 positions shown · IV contrast (agent unspecified)
Comparison: None.

CLINICAL DATA: Pulmonary embolism (PE) suspected, high prob

EXAM:
CT ANGIOGRAPHY CHEST WITH CONTRAST
TECHNIQUE: Multidetector CT imaging of the chest was performed using the
standard protocol during bolus administration of intravenous
contrast. Multiplanar CT image reconstructions and MIPs were
obtained to evaluate the vascular anatomy.

[Series 6: thins · axial · 0.87mm/px · z∈[+1264,+1532]mm · 15 of 300 slices shown]
[im 16/300  lung]
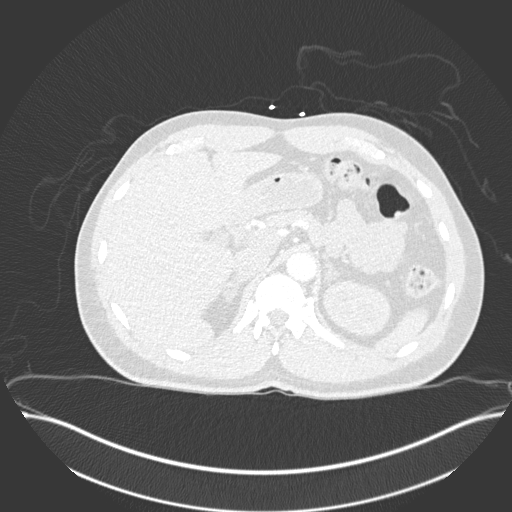
[im 32/300  soft-tissue]
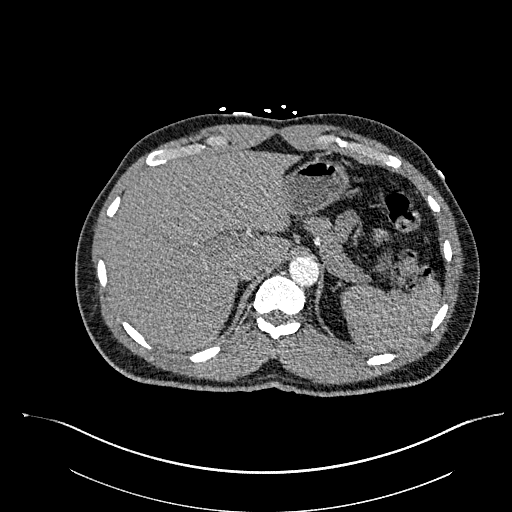
[im 63/300  lung]
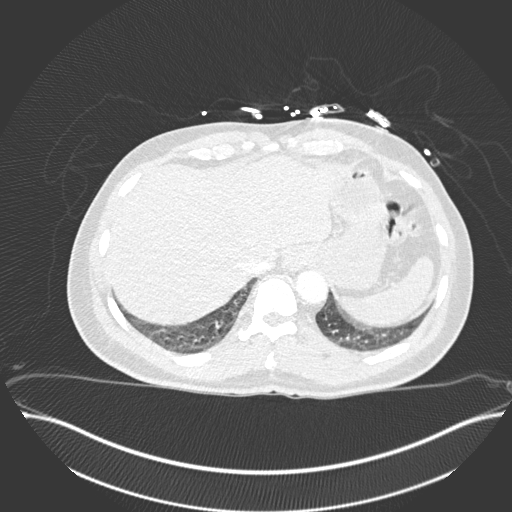
[im 79/300  soft-tissue]
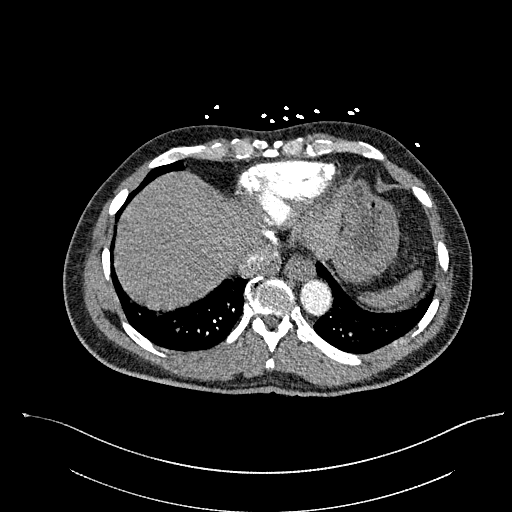
[im 95/300  lung]
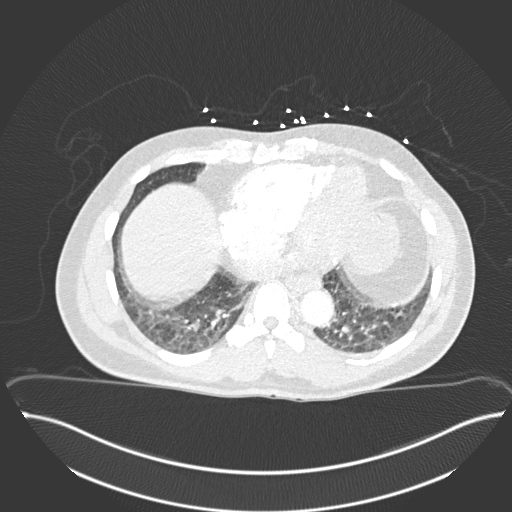
[im 111/300  soft-tissue]
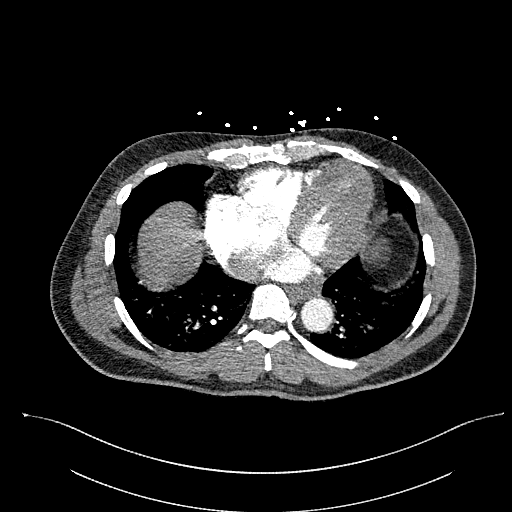
[im 126/300  lung]
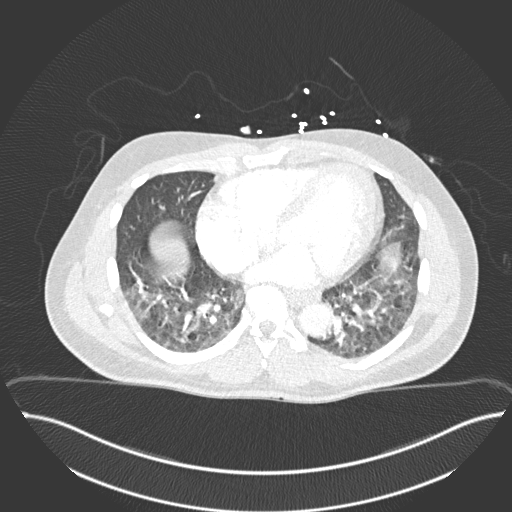
[im 158/300  soft-tissue]
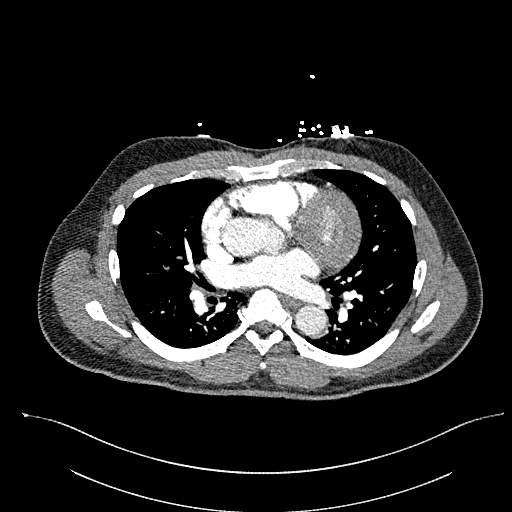
[im 174/300  lung]
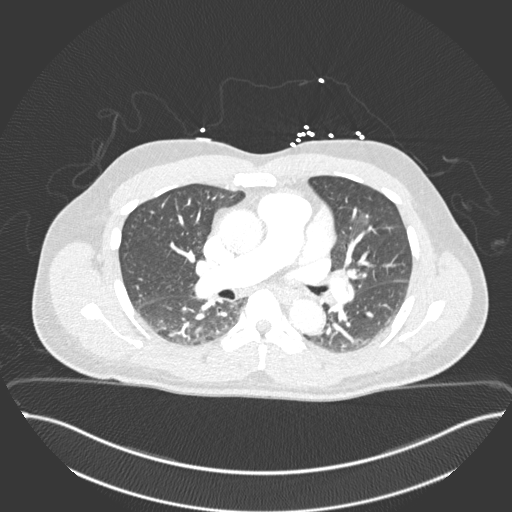
[im 189/300  soft-tissue]
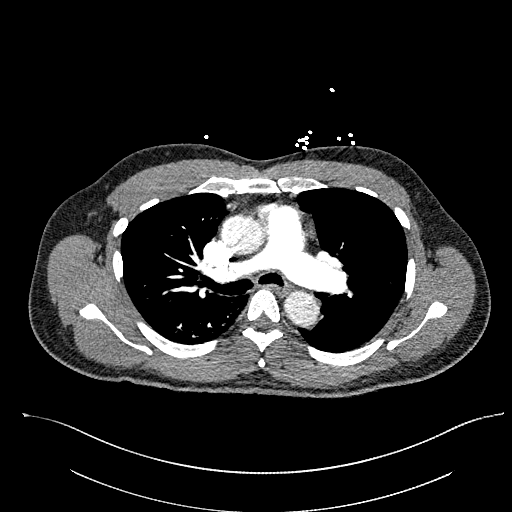
[im 205/300  lung]
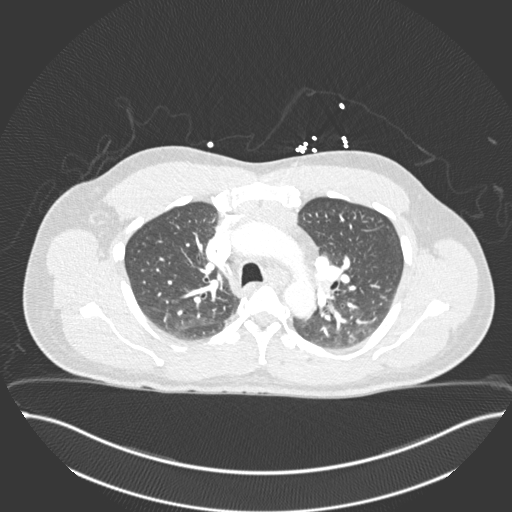
[im 221/300  soft-tissue]
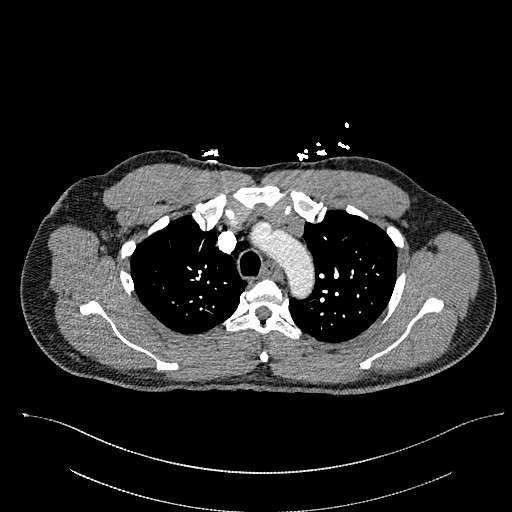
[im 252/300  lung]
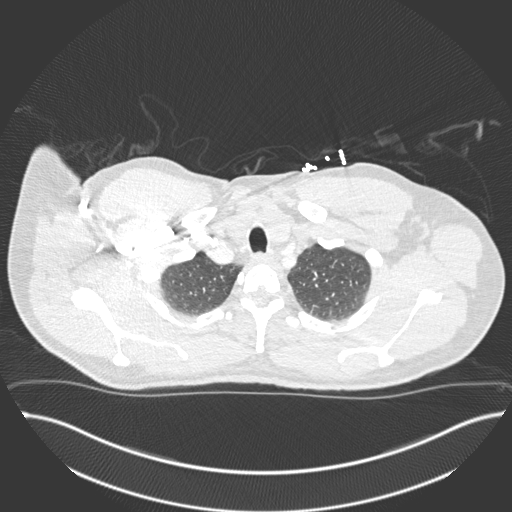
[im 268/300  soft-tissue]
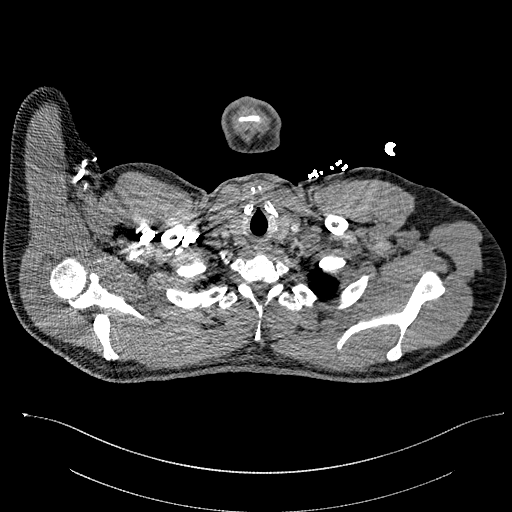
[im 284/300  lung]
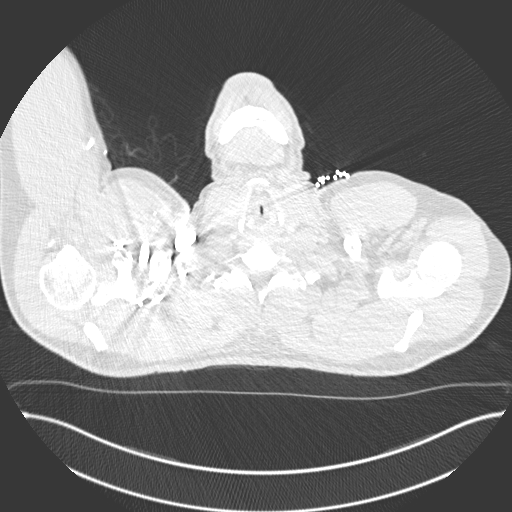

[Series 8: coronal mpr · coronal · 0.62mm/px · 3 of 120 slices shown]
[im 30/120  soft-tissue]
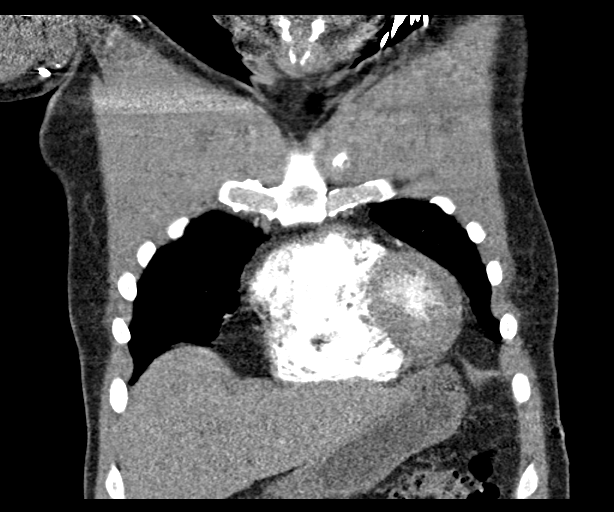
[im 60/120  soft-tissue]
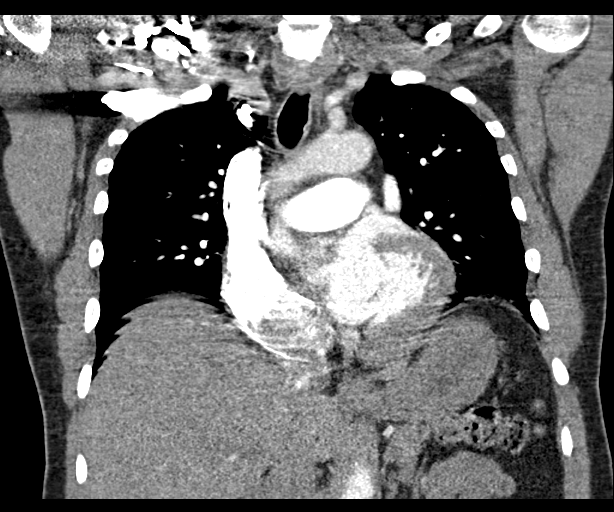
[im 90/120  soft-tissue]
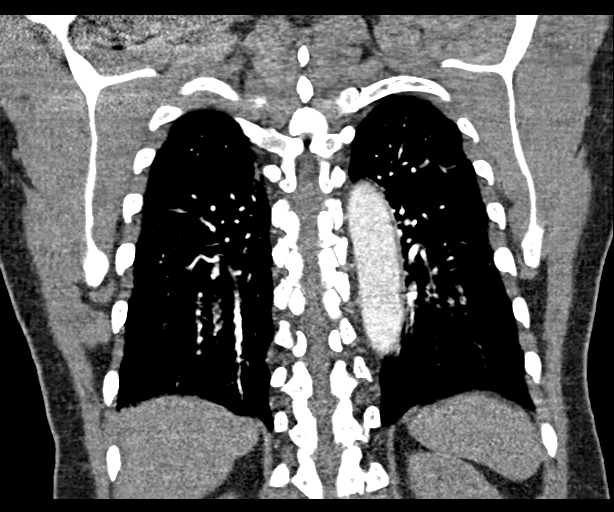

[18 of 46 positions shown; findings below may reference images not displayed]

RADIATION DOSE REDUCTION: This exam was performed according to the
departmental dose-optimization program which includes automated
exposure control, adjustment of the mA and/or kV according to
patient size and/or use of iterative reconstruction technique.

CONTRAST:  80mL OMNIPAQUE IOHEXOL 350 MG/ML SOLN
FINDINGS: Cardiovascular: Satisfactory opacification of the pulmonary arteries
to the segmental level. There are linear nonocclusive filling
defects in several branches including left lower lobe lobar branch,
distal right main pulmonary artery, and right lower lobe segmental
branches. Marked lucent filling defect is present within a right
lower lobe segmental branch. Normal heart size. No evidence of right
heart strain. No pericardial effusion.

Mediastinum/Nodes: Normal thyroid. No enlarged nodes. Esophagus is
unremarkable.

Lungs/Pleura: Bibasilar atelectasis. No pleural effusion or
pneumothorax.

Upper Abdomen: No acute abnormality.

Musculoskeletal: No acute or significant osseous abnormality.

Review of the MIP images confirms the above findings.
IMPRESSION: Pulmonary emboli are present. Many of the filling defects are
nonocclusive and some could be non-acute. This includes small
thrombus within the distal right main pulmonary artery. There is
occlusive filling of a right lower lobe segmental branch, which is
more likely to be acute.

These results were called by telephone at the time of interpretation
on 12/10/2021 at [DATE] to provider Dr. Prins, who verbally
acknowledged these results.

## 2023-09-15 DEATH — deceased
